# Patient Record
Sex: Female | Born: 1970 | Race: White | Hispanic: No | Marital: Married | Smoking: Current every day smoker
Health system: Southern US, Community
[De-identification: ages and names within clinical notes are randomized; demographics above are authoritative.]

## PROBLEM LIST (undated history)

## (undated) HISTORY — PX: TUBAL LIGATION: SHX77

---

## 2001-07-06 ENCOUNTER — Other Ambulatory Visit: Admission: RE | Admit: 2001-07-06 | Discharge: 2001-07-06 | Payer: Self-pay | Admitting: Obstetrics and Gynecology

## 2002-09-01 ENCOUNTER — Other Ambulatory Visit: Admission: RE | Admit: 2002-09-01 | Discharge: 2002-09-01 | Payer: Self-pay | Admitting: Obstetrics and Gynecology

## 2003-09-28 ENCOUNTER — Other Ambulatory Visit: Admission: RE | Admit: 2003-09-28 | Discharge: 2003-09-28 | Payer: Self-pay | Admitting: Obstetrics and Gynecology

## 2004-07-22 ENCOUNTER — Other Ambulatory Visit: Admission: RE | Admit: 2004-07-22 | Discharge: 2004-07-22 | Payer: Self-pay | Admitting: Obstetrics and Gynecology

## 2005-02-06 ENCOUNTER — Inpatient Hospital Stay (HOSPITAL_COMMUNITY): Admission: AD | Admit: 2005-02-06 | Discharge: 2005-02-06 | Payer: Self-pay | Admitting: Obstetrics and Gynecology

## 2005-02-11 ENCOUNTER — Encounter (INDEPENDENT_AMBULATORY_CARE_PROVIDER_SITE_OTHER): Payer: Self-pay | Admitting: Specialist

## 2005-02-11 ENCOUNTER — Inpatient Hospital Stay (HOSPITAL_COMMUNITY): Admission: AD | Admit: 2005-02-11 | Discharge: 2005-02-13 | Payer: Self-pay | Admitting: Obstetrics and Gynecology

## 2005-03-20 ENCOUNTER — Other Ambulatory Visit: Admission: RE | Admit: 2005-03-20 | Discharge: 2005-03-20 | Payer: Self-pay | Admitting: Obstetrics and Gynecology

## 2006-10-23 ENCOUNTER — Encounter: Admission: RE | Admit: 2006-10-23 | Discharge: 2006-10-23 | Payer: Self-pay | Admitting: Obstetrics and Gynecology

## 2006-11-17 IMAGING — US US OB COMP +14 WK
1 series · 13 of 28 positions shown · non-contrast
Comparison: none

CLINICAL DATA: 37 weeks estimated gestational age with contractions and pregnancy induced hypertension.

[Series 1: us ob comp +14 wk · 0.37mm/px · 13 of 32 slices shown]
[im 2/32]
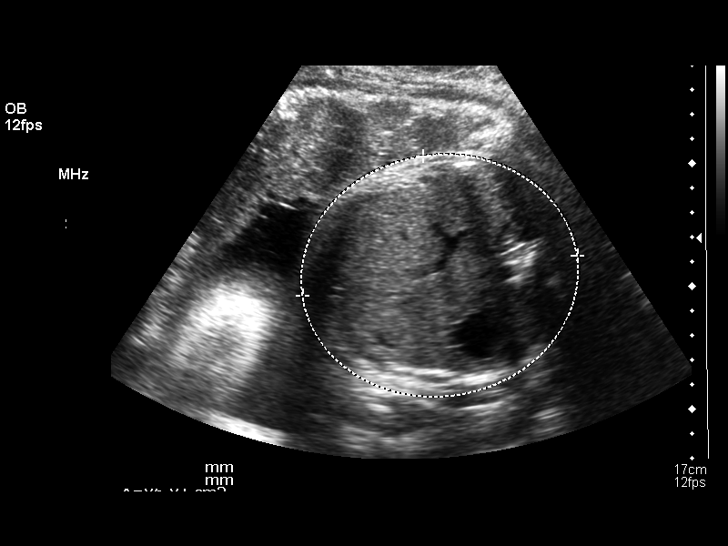
[im 4/32]
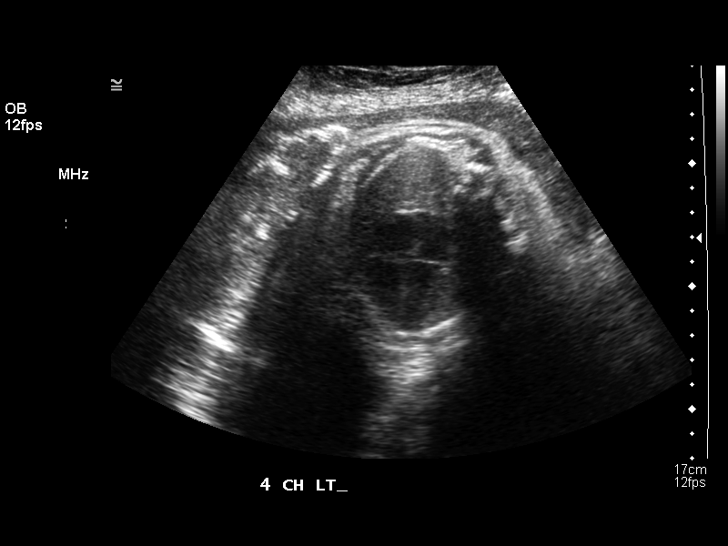
[im 6/32]
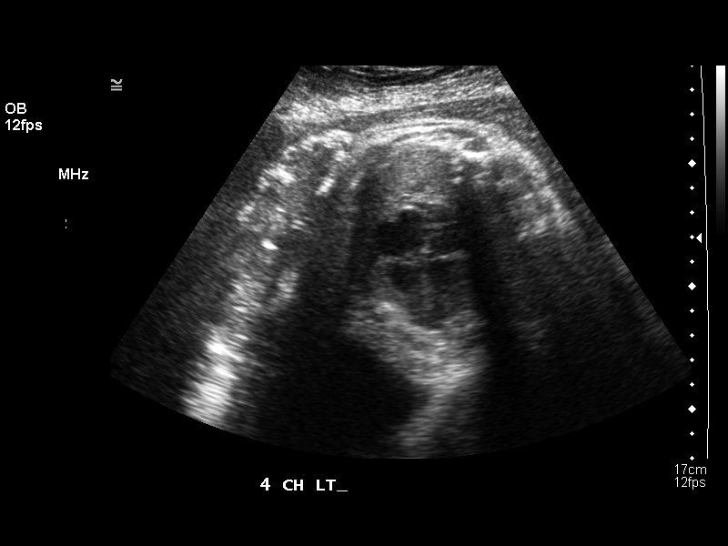
[im 9/32]
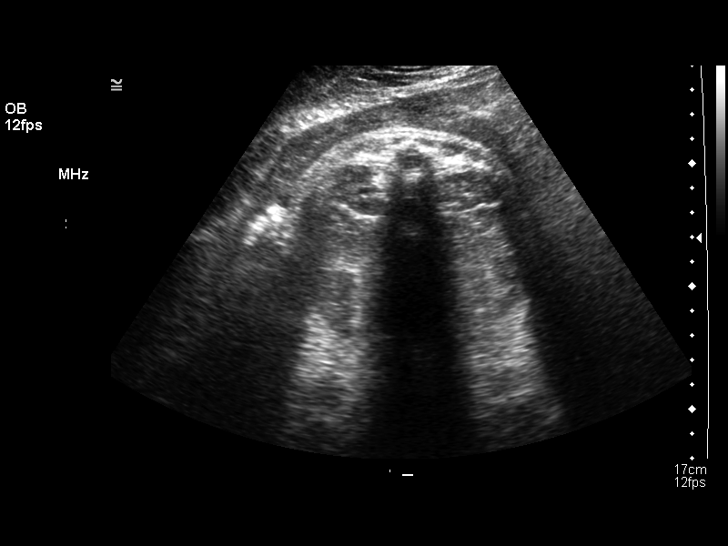
[im 11/32]
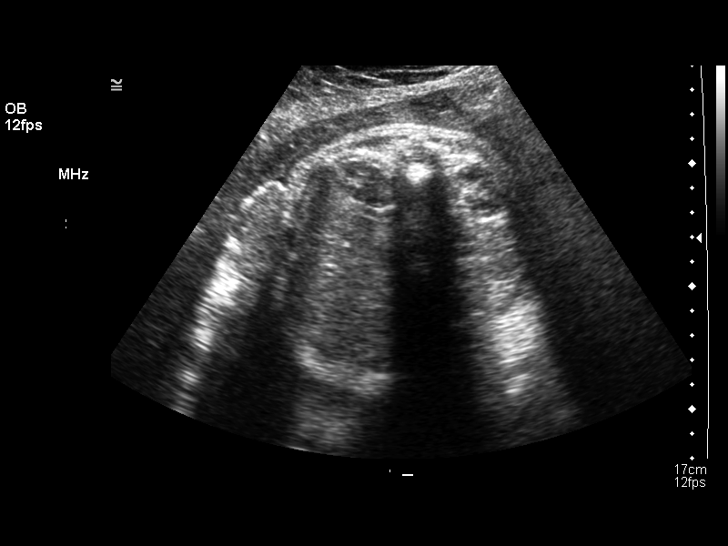
[im 13/32]
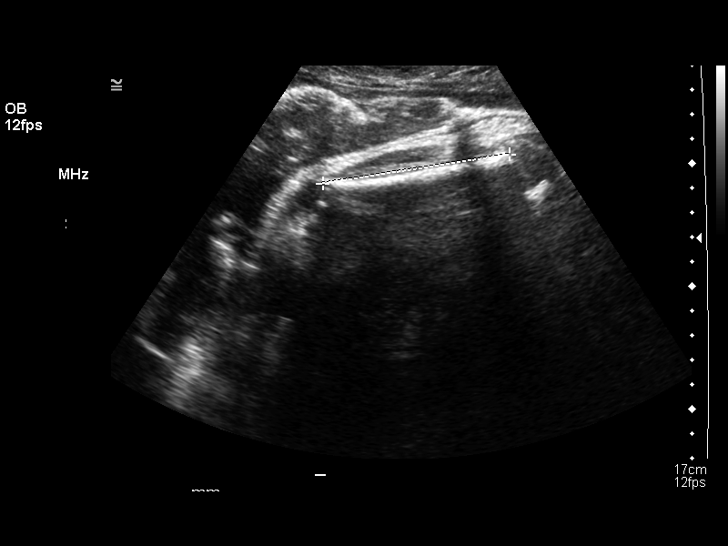
[im 17/32]
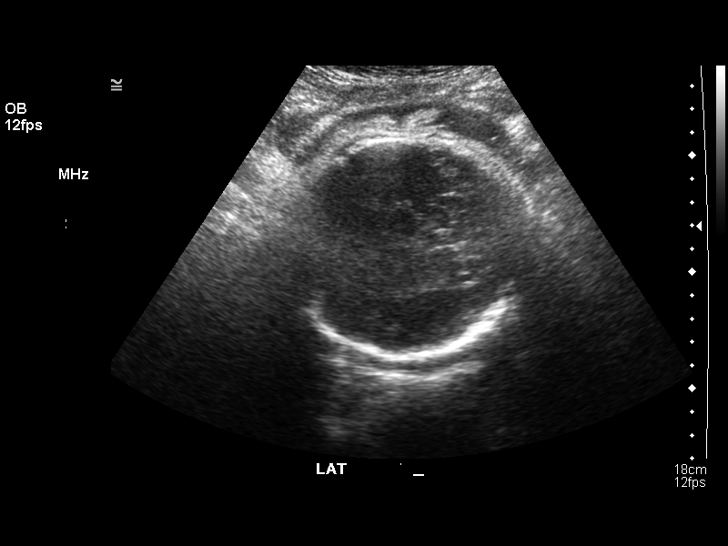
[im 19/32]
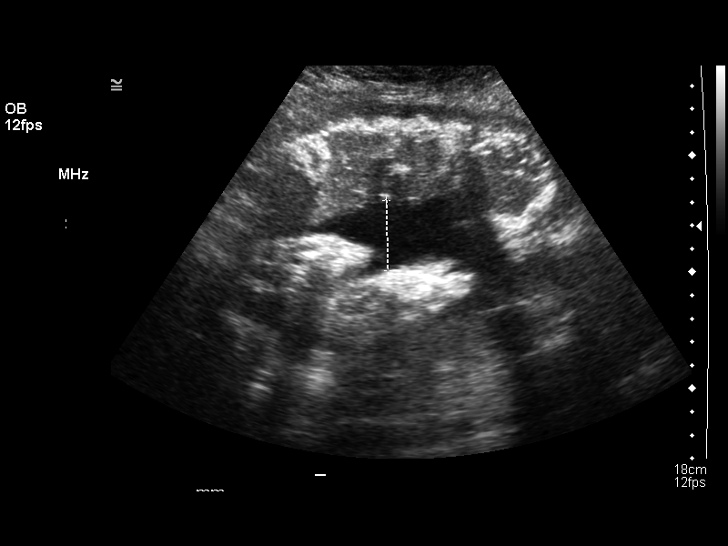
[im 21/32]
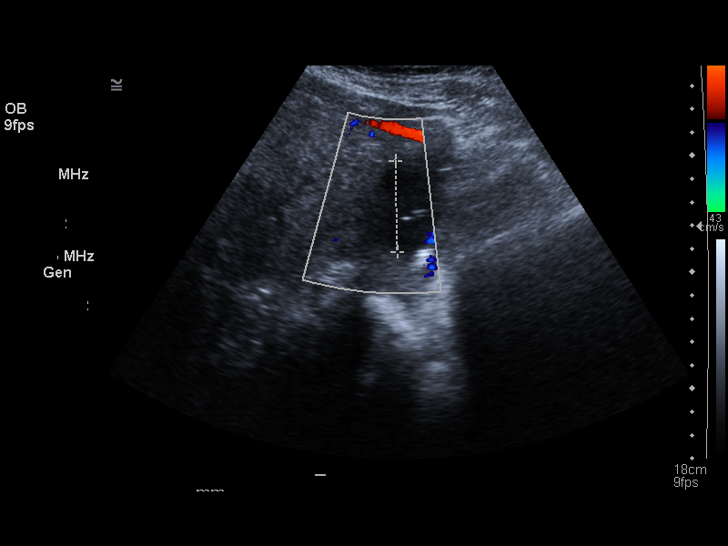
[im 23/32]
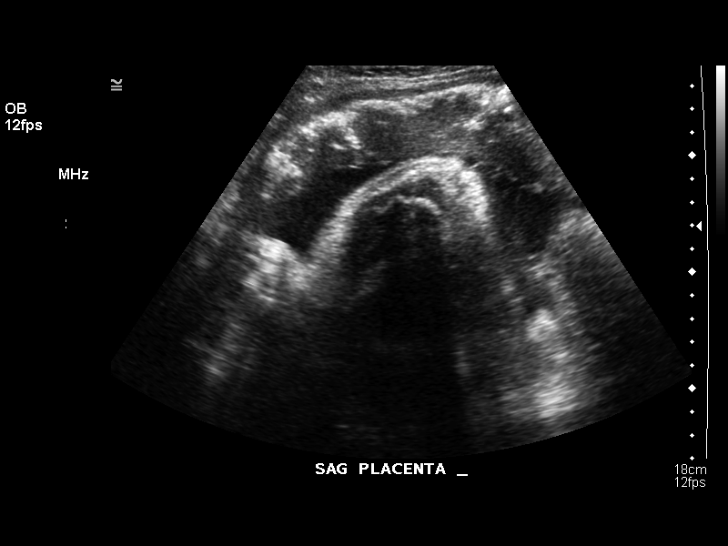
[im 26/32]
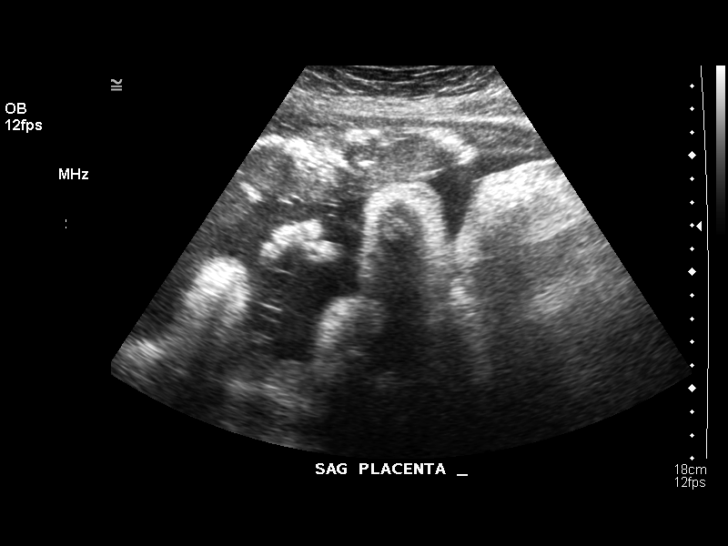
[im 28/32]
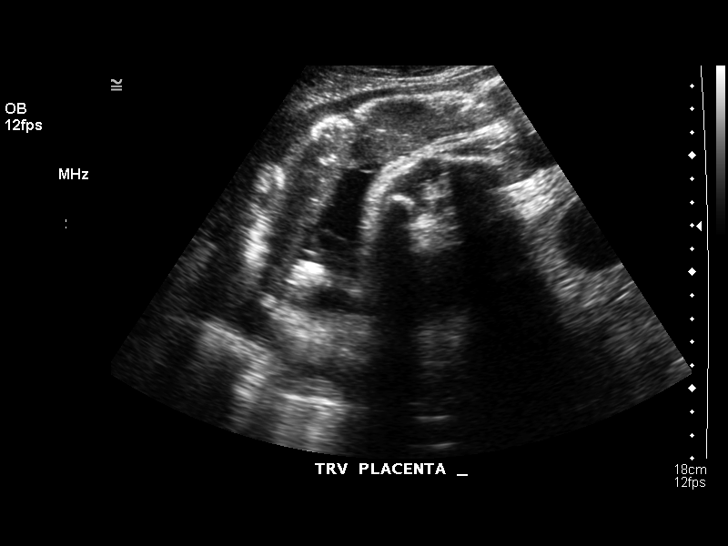
[im 30/32]
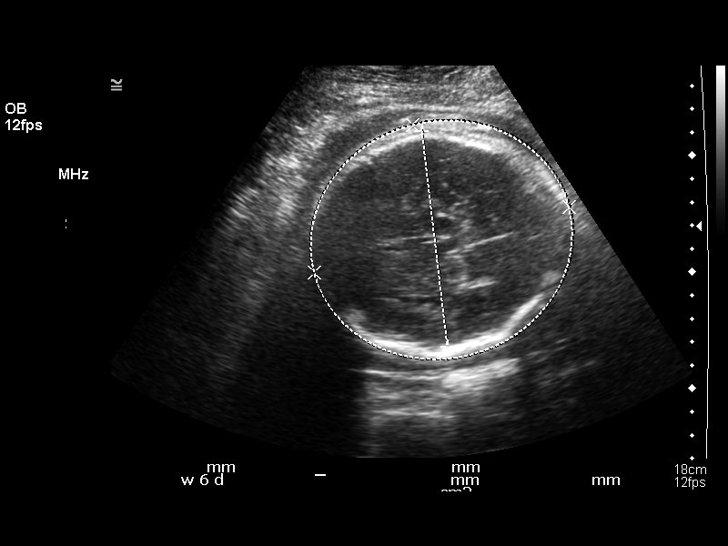

[13 of 28 positions shown; findings below may reference images not displayed]

OBSTETRICAL ULTRASOUND:
Number of Fetuses:  1
Heart Rate:  136
Movement:  Yes
Breathing:  Yes  
Presentation:  Cephalic
Placental Location:  Anterior
Grade:  III
Previa:  No
Amniotic Fluid (Subjective):  Normal
Amniotic Fluid (Objective):   9.4 cm AFI (5th -95th%ile = 7.3 ? 23.9 cm for 38 wks)

FETAL BIOMETRY
BPD:  9.4 cm   38 w 0 d
HC:  33.2 cm  37 w 6 d
AC:  33.9 cm   37 w 6 d
FL:  7.6 cm   39 w 0 d

MEAN GA:  38 w 1 d
Assigned GA:  37 w 6 d  Assigned EDC:  02/21/05
BPD/OFD: .89 (0.70 ? 0.86); FL/BPD:  0.82 (0.71 ? 0.87); FL/AC:  .23 (0.20 ? 0.24); HC/AC: .98 (0.92 ? 1.05)
EFW:  0069 g (H) 50th ? 75th%ile (3133 ? 4041 g) For 38 wks

FETAL ANATOMY
Lateral Ventricles:    Visualized 
Thalami/CSP:      Not visualized 
Posterior Fossa:  Not visualized 
Nuchal Region:      N/A
Spine:      Not visualized 
4 Chamber Heart on Left:      Visualized 
Stomach on Left:      Visualized 
3 Vessel Cord:    Visualized 
Cord Insertion site:    Not visualized 
Kidneys:  Visualized 
Bladder:  Visualized 
Extremities:      Not visualized 

MATERNAL UTERINE AND ADNEXAL FINDINGS
Cervix:   Not evaluated.
IMPRESSION: 1.  Single intrauterine pregnancy demonstrating an estimated gestational age by ultrasound of 38 weeks and 1 day.  Correlation with assigned gestational age of 37 weeks and 6 days suggests appropriate growth.  Currently the estimated fetal weight is between the 50th and 75th percentile for a 38 week gestation.  
2.  Subjectively and quantitatively normal amniotic fluid volume.
3.  No late developing fetal anatomic abnormalities are identified associated with the lateral ventricles, four chamber heart, stomach, kidneys or bladder.

## 2007-07-26 ENCOUNTER — Encounter: Admission: RE | Admit: 2007-07-26 | Discharge: 2007-07-26 | Payer: Self-pay | Admitting: Obstetrics and Gynecology

## 2008-02-08 ENCOUNTER — Ambulatory Visit: Payer: Self-pay | Admitting: Occupational Medicine

## 2009-05-05 IMAGING — MG MM DIAGNOSTIC UNILATERAL L
3 series · 3 of 3 positions shown · non-contrast
Comparison: Mammograms 10-19-06 and 10-23-06.

DG DIAGNOSTIC UNILATERAL L
CC and MLO view(s) were taken of the left breast.
Technologist: Jelks, Romie(BELTRAN RODRIGUEZ)

DIGITAL UNILATERAL LEFT DIAGNOSTIC MAMMOGRAM WITH CAD:
CLINICAL DATA: Six-month follow-up left breast.

[L CC]
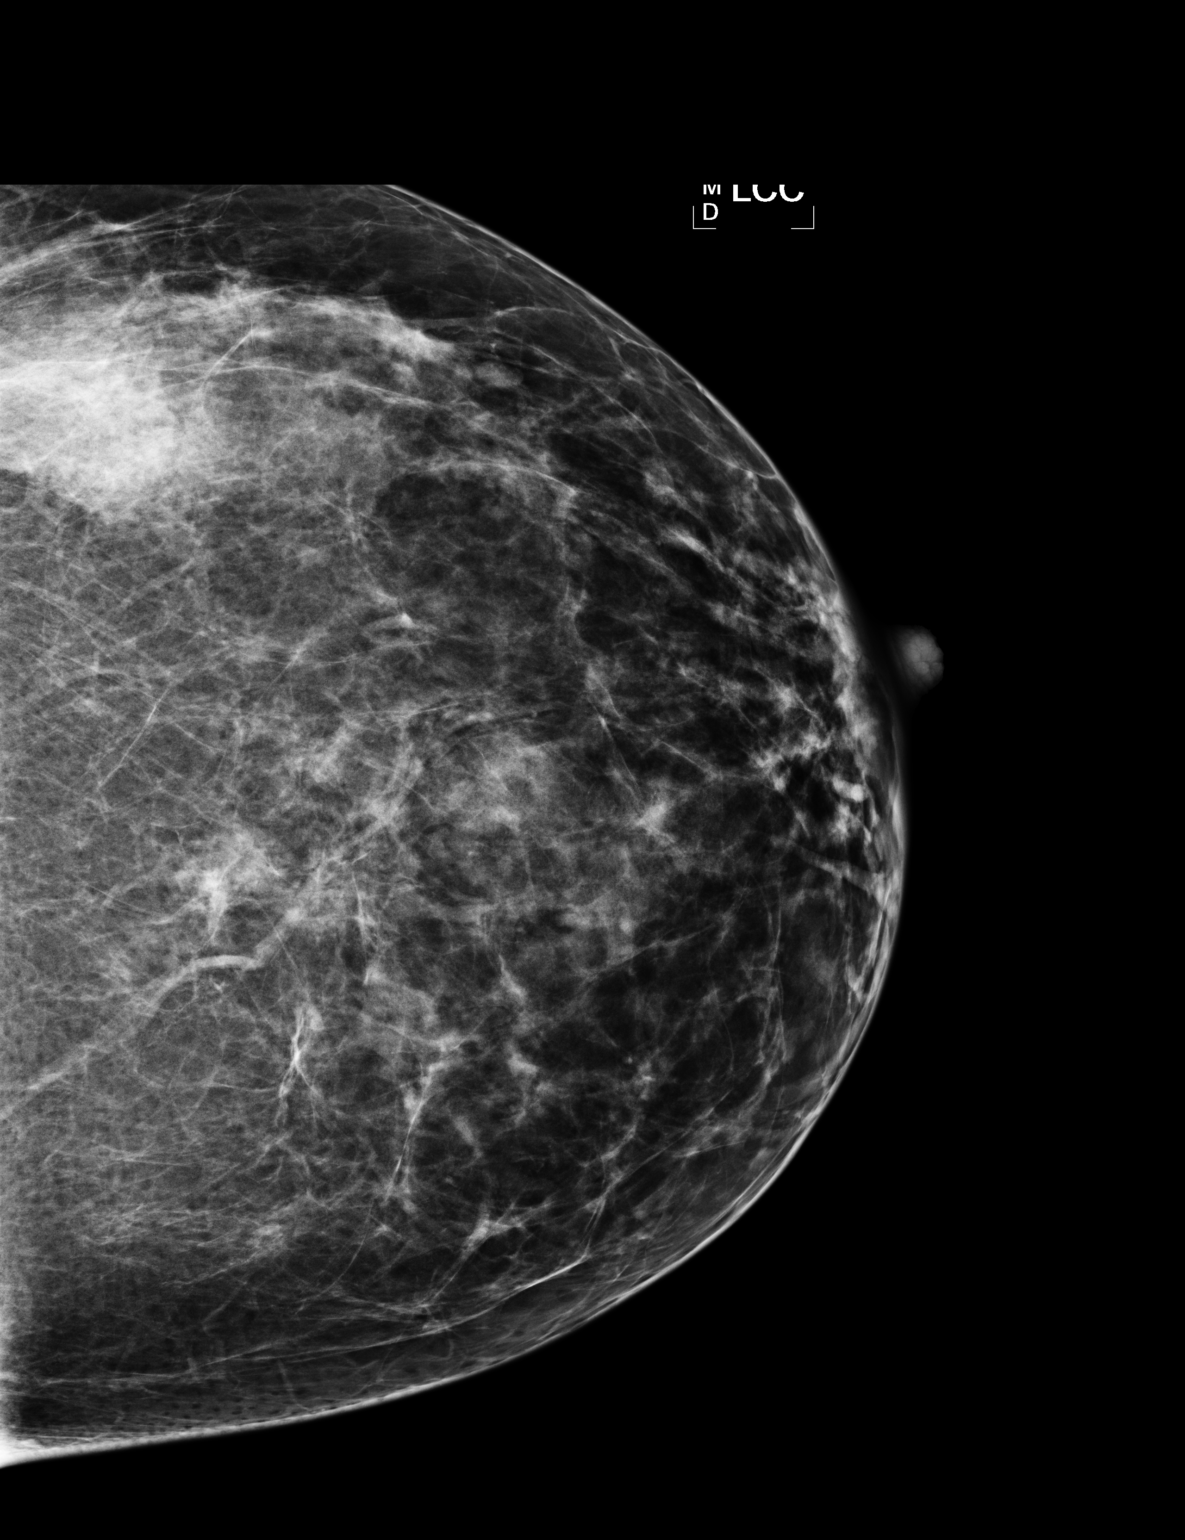

[L MLO (1 of 2)]
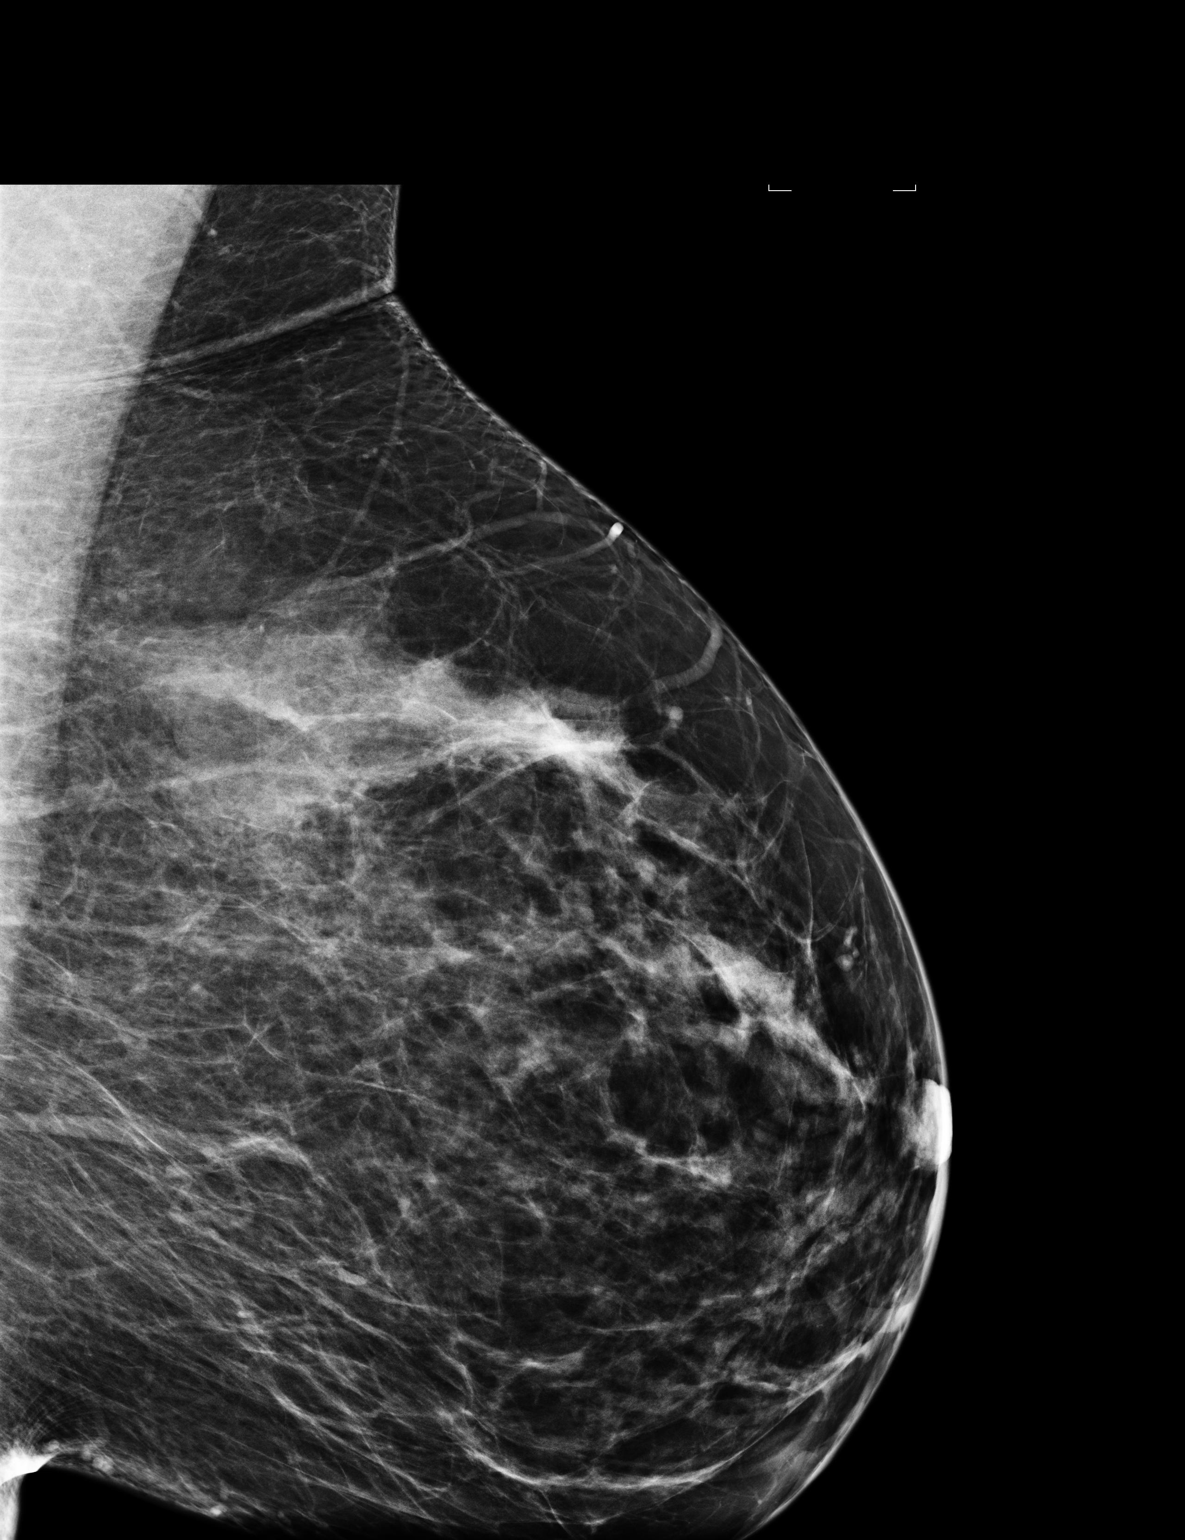

[L MLO (2 of 2)]
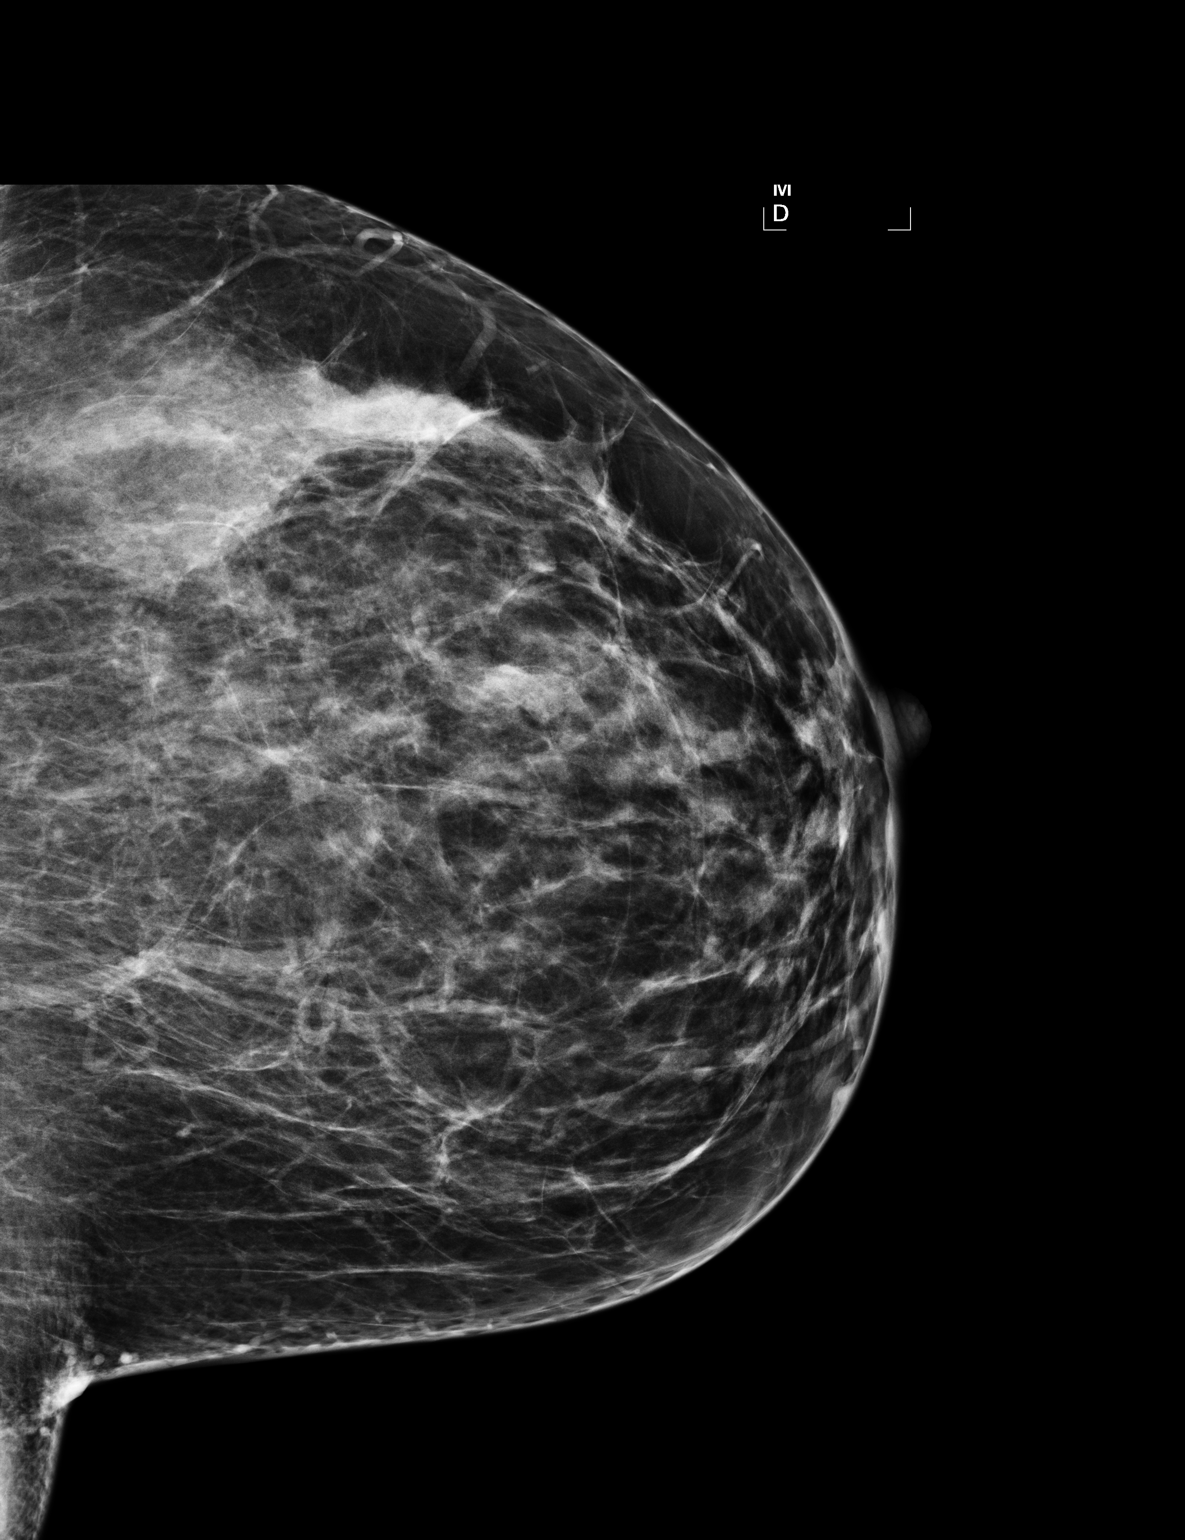

[3 of 3 positions shown; findings below may reference images not displayed]

CC and MLO views of the left breast 07-26-07 are submitted.  Asymmetric breast parenchyma is stable 
in the upper outer quadrant left breast unchanged compared to prior exam and no focal discrete mass
or suspicious microcalcifications are identified.
IMPRESSION: Benign findings.  Recommend routine screening mammogram back on schedule.

ASSESSMENT: Benign - BI-RADS 2

Screening mammogram of both breasts in 3 months.
ANALYZED BY COMPUTER AIDED DETECTION. ,

## 2010-08-16 NOTE — Op Note (Signed)
NAMECYNDIE, WOODBECK            ACCOUNT NO.:  [id number]   MEDICAL RECORD NO.:  [id number]          PATIENT TYPE:  INP   LOCATION:  9143                          FACILITY:  [facility]   PHYSICIAN:  Dineen Kid. Rana Snare, M.D.    DATE OF BIRTH:  06/18/1970   DATE OF PROCEDURE:  02/11/2005  DATE OF DISCHARGE:                                 OPERATIVE REPORT   PREOPERATIVE DIAGNOSIS:  Multiparity, desires sterility.   POSTOPERATIVE DIAGNOSIS:  Multiparity, desires sterility.   OPERATION/PROCEDURE:  Modified P[place]oy bilateral tubal ligation.   SURGEON:  Dineen Kid. Rana Snare, M.D.   ANESTHESIA:  Epidural.   INDICATIONS:  Mrs. Renshaw is a 40 year old, gravida 2, para 2, status post  spontaneous vaginal delivery who adamantly desires sterilization. The risks  and benefits of the procedure were discussed at length which include but not  limited to risk of infection, bleeding, damage to bowel and bladder, uterus,  tubes, ovaries, risk of tubal failure, __________ .  She gives informed  consent and wishes to proceed.   FINDINGS:  Normal fallopian tubes.   DESCRIPTION OF PROCEDURE:  After adequate analgesia, the patient was placed  in the supine position.  A 2 cm infraumbilical skin incision was made and  taken down sharply to the fascia.  The fascia was incised transversely.  Peritoneum was entered sharply  Army-Navy retractors were placed.  The left  fallopian tube was identified, grasped with Babcock clamp, confirmed by the  fimbriated end.  The mid portion was then grasped, totally ligated with 0  plain suture.  The proximal portion was ligated with a 0 silk. The central  portion was excised.  The tubal ostia was noted to be hemostatic and the  tube was then released in to the abdominal cavity.  The right fallopian tube  was identified, grasped with a Babcock clamp, confirmed by the fimbriated  end.  The mid portion of the tube was regrasped, doubly ligated with 0 plain  suture.  The proximal portion  was ligated with a 0 silk.  The mid portion  was then excised.  The tubal ostia was noted to be hemostatic. This released  back into the abdominal cavity.  The fascia was then closed with 0 Vicryl  figure-of-eight.  The skin was then closed in the subcuticular fashion with  3-0 Vicryl Rapide suture.  The incision was then injected with 0.25%  Marcaine, 10 mL total used.  The patient was stable on transferred to the  recovery room.  Inspection of the skin was normal x3.  Estimated blood loss  was less than 5 mL.      Dineen Kid Rana Snare, M.D.  Electronically Signed     DCL/MEDQ  D:  02/11/2005  T:  02/12/2005  Job:  [id number]

## 2013-08-14 ENCOUNTER — Emergency Department (HOSPITAL_COMMUNITY): Payer: BC Managed Care – PPO

## 2013-08-14 ENCOUNTER — Encounter (HOSPITAL_COMMUNITY): Payer: Self-pay | Admitting: Emergency Medicine

## 2013-08-14 ENCOUNTER — Emergency Department (HOSPITAL_COMMUNITY)
Admission: EM | Admit: 2013-08-14 | Discharge: 2013-08-14 | Disposition: A | Discharge status: Home / Self Care | Payer: BC Managed Care – PPO | Attending: Emergency Medicine | Admitting: Emergency Medicine

## 2013-08-14 DIAGNOSIS — Z3202 Encounter for pregnancy test, result negative: Secondary | ICD-10-CM | POA: Insufficient documentation

## 2013-08-14 DIAGNOSIS — R0789 Other chest pain: Secondary | ICD-10-CM

## 2013-08-14 DIAGNOSIS — R071 Chest pain on breathing: Secondary | ICD-10-CM | POA: Insufficient documentation

## 2013-08-14 DIAGNOSIS — F17200 Nicotine dependence, unspecified, uncomplicated: Secondary | ICD-10-CM | POA: Insufficient documentation

## 2013-08-14 LAB — CBC WITH DIFFERENTIAL/PLATELET
Basophils Absolute: 0 10*3/uL (ref 0.0–0.1)
Basophils Relative: 0 % (ref 0–1)
Eosinophils Absolute: 0.1 10*3/uL (ref 0.0–0.7)
Eosinophils Relative: 1 % (ref 0–5)
HCT: 38.9 % (ref 36.0–46.0)
Hemoglobin: 13.6 g/dL (ref 12.0–15.0)
LYMPHS ABS: 3.3 10*3/uL (ref 0.7–4.0)
Lymphocytes Relative: 39 % (ref 12–46)
MCH: 32.2 pg (ref 26.0–34.0)
MCHC: 35 g/dL (ref 30.0–36.0)
MCV: 92 fL (ref 78.0–100.0)
MONO ABS: 0.6 10*3/uL (ref 0.1–1.0)
Monocytes Relative: 7 % (ref 3–12)
Neutro Abs: 4.4 10*3/uL (ref 1.7–7.7)
Neutrophils Relative %: 53 % (ref 43–77)
Platelets: 214 10*3/uL (ref 150–400)
RBC: 4.23 MIL/uL (ref 3.87–5.11)
RDW: 12.2 % (ref 11.5–15.5)
WBC: 8.4 10*3/uL (ref 4.0–10.5)

## 2013-08-14 LAB — URINALYSIS, ROUTINE W REFLEX MICROSCOPIC
BILIRUBIN URINE: NEGATIVE
Glucose, UA: NEGATIVE mg/dL
HGB URINE DIPSTICK: NEGATIVE
Ketones, ur: NEGATIVE mg/dL
Leukocytes, UA: NEGATIVE
Nitrite: NEGATIVE
Protein, ur: NEGATIVE mg/dL
SPECIFIC GRAVITY, URINE: 1.025 (ref 1.005–1.030)
Urobilinogen, UA: 0.2 mg/dL (ref 0.0–1.0)
pH: 6 (ref 5.0–8.0)

## 2013-08-14 LAB — COMPREHENSIVE METABOLIC PANEL
ALT: 15 U/L (ref 0–35)
AST: 14 U/L (ref 0–37)
Albumin: 3.5 g/dL (ref 3.5–5.2)
Alkaline Phosphatase: 47 U/L (ref 39–117)
BUN: 12 mg/dL (ref 6–23)
CO2: 24 mEq/L (ref 19–32)
Calcium: 8.7 mg/dL (ref 8.4–10.5)
Chloride: 105 mEq/L (ref 96–112)
Creatinine, Ser: 0.82 mg/dL (ref 0.50–1.10)
GFR calc Af Amer: >90 mL/min (ref >90)
GFR calc non Af Amer: 86 mL/min — ABNORMAL LOW (ref >90)
Glucose, Bld: 89 mg/dL (ref 70–99)
Potassium: 4.3 mEq/L (ref 3.7–5.3)
Sodium: 139 mEq/L (ref 137–147)
Total Bilirubin: 0.3 mg/dL (ref 0.3–1.2)
Total Protein: 6.3 g/dL (ref 6.0–8.3)

## 2013-08-14 LAB — LIPASE, BLOOD: Lipase: 27 U/L (ref 11–59)

## 2013-08-14 LAB — PREGNANCY, URINE: Preg Test, Ur: NEGATIVE

## 2013-08-14 MED ORDER — TRAMADOL HCL 50 MG PO TABS
50.0000 mg | ORAL_TABLET | Freq: Once | ORAL | Status: AC
  Administered 2013-08-14: 50 mg via ORAL
  Filled 2013-08-14: qty 1

## 2013-08-14 MED ORDER — NAPROXEN 250 MG PO TABS: 250.0000 mg | ORAL_TABLET | Freq: Two times a day (BID) | ORAL | Status: AC

## 2013-08-14 MED ORDER — IBUPROFEN 400 MG PO TABS
400.0000 mg | ORAL_TABLET | Freq: Once | ORAL | Status: AC
  Administered 2013-08-14: 400 mg via ORAL
  Filled 2013-08-14: qty 1

## 2013-08-14 MED ORDER — METHOCARBAMOL 500 MG PO TABS: 1000.0000 mg | ORAL_TABLET | Freq: Four times a day (QID) | ORAL | Status: AC | PRN

## 2013-08-14 MED ORDER — TRAMADOL HCL 50 MG PO TABS: 50.0000 mg | ORAL_TABLET | Freq: Four times a day (QID) | ORAL | Status: AC | PRN

## 2013-08-14 NOTE — ED Notes (Signed)
Patient with no complaints at this time. Respirations even and unlabored. Skin warm/dry. Discharge instructions reviewed with patient at this time. Patient given opportunity to voice concerns/ask questions. Patient discharged at this time and left Emergency Department with steady gait.   

## 2013-08-14 NOTE — Discharge Instructions (Signed)
°Emergency Department Resource Guide °1) Find a Doctor and Pay Out of Pocket °Although you won't have to find out who is covered by your insurance plan, it is a good idea to ask around and get recommendations. You will then need to call the office and see if the doctor you have chosen will accept you as a new patient and what types of options they offer for patients who are self-pay. Some doctors offer discounts or will set up payment plans for their patients who do not have insurance, but you will need to ask so you aren't surprised when you get to your appointment. ° °2) Contact Your Local Health Department °Not all health departments have doctors that can see patients for sick visits, but many do, so it is worth a call to see if yours does. If you don't know where your local health department is, you can check in your phone book. The CDC also has a tool to help you locate your state's health department, and many state websites also have listings of all of their local health departments. ° °3) Find a Walk-in Clinic °If your illness is not likely to be very severe or complicated, you may want to try a walk in clinic. These are popping up all over the country in pharmacies, drugstores, and shopping centers. They're usually staffed by nurse practitioners or physician assistants that have been trained to treat common illnesses and complaints. They're usually fairly quick and inexpensive. However, if you have serious medical issues or chronic medical problems, these are probably not your best option. ° °No Primary Care Doctor: °- Call [facility] at  832-8000 - they can help you locate a primary care doctor that  accepts your insurance, provides certain services, etc. °- Physician Referral Service- 1-800-533-3463 ° °Chronic Pain Problems: °Organization         Address  Phone   Notes  °[facility]  (336) [phone] Patients need to be referred by their primary care doctor.  ° °Medication  Assistance: °Organization         Address  Phone   Notes  °[place] [facility] [address] °[place], [place] [address] (336) 641-8030 --Must be a resident of [place] °-- Must have NO insurance coverage whatsoever (no Medicaid/ Medicare, etc.) °-- The pt. MUST have a primary care doctor that directs their care regularly and follows them in the community °  °[facility]  (866) 331-1348   °[employer]  (888) 892-1162   ° °Agencies that provide inexpensive medical care: °Organization         Address  Phone   Notes  °[facility]  (336) 832-8035   °[facility]    (336) 832-7272   °[facility] [address] °[place], [place] [address] (336) 832-4777   °[facility] [address], °[place] (336) 271-4999   °[facility]    (336) 373-0678   °[facility]    (336) 272-1050   °[facility] ° [address], [place] Phone:  (336) 832-4444, Fax:  (336) 832-4440 Hours of Operation:  9 am - 6 pm, M-F.  Also accepts Medicaid/Medicare and self-pay.  °[facility] ° [address], [place] Phone: (336) [phone], Fax: (336) [phone]. Hours of Operation:  8:30 am - 5:30 pm, M-F.  Also accepts Medicaid and self-pay.  °[facility] [place] [address], [place] Phone: (336) 878-6027   °[facility] [address], [place], [place] (336)723-1848, Ext. 123 Mondays & Thursdays: 7-9 AM.  First 15 patients are seen on a first come, first serve basis. °  ° °Medicaid-accepting [place] Providers: ° °Organization         Address  Phone   Notes  °[facility] [address], [place] (336) 641-2100 Also accepts self-pay patients.  °[facility] [address], [place] ° (336) 856-9996   °[facility] [address], [place]  (336) 288-8857   °[facility] [address] [place] [address], [place] (336) 299-7000   °Veita Bland [address], [place]  ° (336) 373-1557 Only accepts [place] Access Medicaid patients after they have their name applied to their card.  ° °Self-Pay (no insurance) in [place]: ° °Organization         Address  Phone   Notes  °Sickle Cell Patients, [facility] [address], [place] (336) 832-1970   °[facility] [address], [place] (336) 832-4400   °[facility] ° [address], [place] (336) 992-4800   °[facility]/Dr. Osei-Bonsu ° [address] [place] [address], [place] or [address], [place] (336) [phone] Phone number for both [place] and [place] locations is the same.  °[facility] [address], [place] (336) 299-0000   °[facility] [address] [place] [address], [place] or [address] (336) 852-7530 °(336) 878-2260   °[facility] [address], [place] (336) 350-1642, phone; (336) 294-5005, fax Sees patients 1st and 3rd Saturday of every month.  Must not qualify for public or private insurance (i.e. Medicaid, Medicare, [place] Health Choice, Veterans' Benefits) • Household income should be no more than 200% of the poverty level •The clinic cannot treat you if you are pregnant or think you are pregnant • Sexually transmitted diseases are not treated at the clinic.  ° ° °Dental Care: °Organization         Address  Phone  Notes  °[place] [facility] [address], [place] (336) 641-6152 Accepts children up to age 21 who are enrolled in Medicaid or [place] Health Choice; pregnant women with a Medicaid card; and children who have applied for Medicaid or [place] Health Choice, but were declined, whose parents can pay a reduced fee at time of service.  °[place]  [facility] [place]  [address], [place] (336) 641-7733 Accepts children up to age 21 who are enrolled in Medicaid or [place] Health Choice; pregnant women with a Medicaid card; and children who have applied for Medicaid or [place] Health Choice, but were declined, whose parents can pay a reduced fee at time of service.  °[facility] ° [address], [place] (336) 641-4533 Patients are seen by appointment only. Walk-ins are not accepted. [facility] will see patients 18 years of age and older. °Monday - Tuesday (8am-5pm) °Most Wednesdays (8:30-5pm) °$30 per visit, cash only  °[facility] ° [address], [place] (336) 641-4533 Patients are seen by appointment only. Walk-ins are not accepted. [facility] will see patients 18 years of age and older. °One   Wednesday Evening (Monthly: Volunteer Based).  $30 per visit, cash only  °[facility]  (919) [phone] for adults; Children under age 4, call Graduate Pediatric Dentistry at ([phone]) [phone]. Children aged 4-14, please call ([phone]) [phone] to request a pediatric application. ° Dental services are provided in all areas of dental care including fillings, crowns and bridges, complete and partial dentures, implants, gum treatment, root canals, and extractions. Preventive care is also provided. Treatment is provided to both adults and children. °Patients are selected via a lottery and there is often a waiting list. °  °[facility] [address], °[place] ° (336) 763-8833 [email or web address] °  °[facility] [address], [place], [place] (336)723-1848, Ext. 123 Second and Fourth Thursday of each month, opens at 6:30 AM; Clinic ends at 9 AM.  Patients are seen on a first-come first-served basis, and a limited number are seen during each clinic.  ° °[facility] ° [address], [place], [place] (336) 723-7904    Eligibility Requirements °You must have lived in [place], [place], or [place] counties for at least the last three months. °  You cannot be eligible for state or federal sponsored healthcare insurance, including Veterans Administration, Medicaid, or Medicare. °  You generally cannot be eligible for healthcare insurance through your employer.  °  How to apply: °Eligibility screenings are held every Tuesday and Wednesday afternoon from 1:00 pm until 4:00 pm. You do not need an appointment for the interview!  °[facility] [address], [place], [place] 336-631-2330   °[facility]  336-342-8273   °[facility]  336-703-3100   °[facility]  336-570-6415   ° °Behavioral Health Resources in the Community: °Intensive Outpatient Programs °Organization         Address  Phone  Notes  °[facility] [address], [place], [place] 336-878-6098   °[facility] [address], [place], [place] 336-832-9800   °[facility] [address], [place], [place] ° 336-882-2125   °[facility] [address],  °[place], [place] 1-800-853-5163 or 336-641-4981   °Substance Abuse Resources °Organization         Address  Phone  Notes  °[facility]  336-882-2125   °[facility]  336-784-9470   °[facility]  336-285-9073   °[facility]  336-845-3988   °Residential & Outpatient Substance Abuse Program  1-800-659-3381   °Psychological Services °Organization         Address  Phone  Notes  °[facility]  336- 832-9600   °[facility]  336- 378-7881   °[facility] [address], [place] 1-800-853-5163 or 336-641-4981   ° °Mobile Crisis Teams °Organization         Address  Phone  Notes  °[facility], Mobile Crisis Care Unit  1-877-626-1772   °Assertive °[facility] ° [address]  [place], [place] 336-834-9664   °Sharon DeEsch [address] °[place] 336-554-5454   ° °Self-Help/Support Groups °Organization         Address  Phone             Notes  °[facility] of  - variety of support groups  336- 373-1402 Call for more information  °Narcotics Anonymous (NA), [facility] [address], °[place]  2 meetings at this location  ° °  Residential Treatment Programs Organization         Address  Phone  Notes  [facility] [address],    [place]  [phone]   [facility]  [address], [place] [phone], [place], [place] [phone]   [facility] [address], [place] [phone] Admissions: 8am-3pm M-F  [facility] [address]    [place], [place] [phone]   [facility] [address], [place], [place] [phone]   [facility] [address]  [place], [place] [phone]   [facility] Intensive Outpatient [address], [place], [place] [phone]   [facility] ([facility] [address]  [place], [place] [phone])[phone] or [phone]   [facility] ([facility]) [address] [place], [place] [phone] Accepts Medicaid  [facility] [address]  [place] [phone] Substance Abuse/Addiction Treatment   [facility] Organization         Address  Phone  Notes  [facility]  (949)528-4241(888) [phone]   Angie FavaJulie Brannon, PhD [address], [address] [place], [place]   ([phone](336) ([phone] or [phone](336) [phone]   [facility]   [address] [place], [place] [phone](336) [phone]   [facility] [address], [place], [place] [phone](336) [phone] Insurance/Medicaid/sponsorship through [facility] [address]                                    [place], [place] ([phone](336) [phone] Therapy/tele-psych/case    [facility] [address]   [place], [place] [phone](336) [phone]    Dr. Lolly MustacheArfeen  ([phone](336) [phone]   [facility] [place]  [employer] [facility] 1) [address], [place] 2) [address], [place] 3)  [address], [place] [phone](336) [phone](336) [phone]  [phone](336) [phone]   [facility] Child Abuse Hot[facility] (331)096-2633(336) 503-478-9536 or 445-198-9596(336) [phone] (After Hours)       Take the prescriptions as directed.  Apply moist heat or ice to the area(s) of discomfort, for 15 minutes at a time, several times per day for the next few days.  Do not fall asleep on a heating or ice pack.  Call your regular medical doctor tomorrow morning to schedule a follow up appointment within the next 2 days.  Return to the Emergency Department immediately if worsening.

## 2013-08-14 NOTE — ED Notes (Signed)
MD at bedside. 

## 2013-08-14 NOTE — ED Provider Notes (Signed)
CSN: [id number]     Arrival date & time 08/14/13  1158 History   First MD Initiated Contact with Patient 08/14/13 1315     Chief Complaint  Patient presents with  . Chest Pain      HPI Pt was seen at 1330. Per pt, c/o gradual onset and slow improvement in one episode of right sided lateral chest wall "pain" that began this morning. Pt states the is located in her right lateral chest wall, right side mid-back, and right neck areas. Describes the pain as "sharp." Pain worsens with coughing, palpation of the area and body position changes. Pt states she has been "working out in the yard" the past few days. Pt is right handed. Denies abd pain, no fevers, no N/V/D, no fevers, no palpitations, no SOB, no rash, no injury.      History reviewed. No pertinent past medical history.  Past Surgical History  Procedure Laterality Date  . Tubal ligation      History  Substance Use Topics  . Smoking status: Current Every Day Smoker  . Smokeless tobacco: Not on file  . Alcohol Use: No    Review of Systems ROS: Statement: All systems negative except as marked or noted in the HPI; Constitutional: Negative for fever and chills. ; ; Eyes: Negative for eye pain, redness and discharge. ; ; ENMT: Negative for ear pain, hoarseness, nasal congestion, sinus pressure and sore throat. ; ; Cardiovascular: Negative for palpitations, diaphoresis, dyspnea and peripheral edema. ; ; Respiratory: Negative for cough, wheezing and stridor. ; ; Gastrointestinal: Negative for nausea, vomiting, diarrhea, abdominal pain, blood in stool, hematemesis, jaundice and rectal bleeding. . ; ; Genitourinary: Negative for dysuria, flank pain and hematuria. ; ; Musculoskeletal: +right neck pain, right back pain, right lateral chest wall pain. Negative for swelling and trauma.; ; Skin: Negative for pruritus, rash, abrasions, blisters, bruising and skin lesion.; ; Neuro: Negative for headache, lightheadedness and neck stiffness. Negative for  weakness, altered level of consciousness , altered mental status, extremity weakness, paresthesias, involuntary movement, seizure and syncope.      Allergies  Hydrocodone-acetaminophen  Home Medications   Prior to Admission medications   Not on File   BP 147/89  Pulse 84  Temp(Src) 97.9 F (36.6 C) (Oral)  Resp 16  Ht 5\' 2"  (1.575 m)  Wt 180 lb (81.647 kg)  BMI 32.91 kg/m2  SpO2 99% Physical Exam 1335: Physical examination:  Nursing notes reviewed; Vital signs and O2 SAT reviewed;  Constitutional: Well developed, Well nourished, Well hydrated, In no acute distress; Head:  Normocephalic, atraumatic; Eyes: EOMI, PERRL, No scleral icterus; ENMT: TM's clear bilat. Mouth and pharynx normal, Mucous membranes moist; Neck: Supple, Full range of motion, No lymphadenopathy; Cardiovascular: Regular rate and rhythm, No murmur, rub, or gallop; Respiratory: Breath sounds clear & equal bilaterally, No rales, rhonchi, wheezes.  Speaking full sentences with ease, Normal respiratory effort/excursion; Chest: +right lower posterior-lateral chest wall tender to palp. No deformity, no soft tissue crepitus, no rash. Movement normal; Abdomen: Soft, Nontender, Nondistended, Normal bowel sounds; Genitourinary: No CVA tenderness; Spine:  No midline CS, TS, LS tenderness. +TTP right hypertonic trapezius and thoracic paraspinal muscles.;; Extremities: Pulses normal, No tenderness, No edema, No calf edema or asymmetry.; Neuro: AA&Ox3, Major CN grossly intact.  Speech clear. No gross focal motor or sensory deficits in extremities. Climbs on and off stretcher easily by herself. Gait steady.; Skin: Color normal, Warm, Dry.   ED Course  Procedures     EKG  Interpretation   Date/Time:  Sunday Aug 14 2013 12:19:12 EDT Ventricular Rate:  76 PR Interval:  108 QRS Duration: 82 QT Interval:  382 QTC Calculation: 429 R Axis:   69 Text Interpretation:  Sinus rhythm with short PR Otherwise normal ECG  Confirmed by Adriana SimasOOK   MD, BRIAN ([id number]) on 08/14/2013 12:45:03 PM      MDM  MDM Reviewed: previous chart, nursing note and vitals Reviewed previous: labs Interpretation: labs, ECG and x-ray    Results for orders placed during the hospital encounter of 08/14/13  URINALYSIS, ROUTINE W REFLEX MICROSCOPIC      Result Value Ref Range   Color, Urine YELLOW  YELLOW   APPearance CLEAR  CLEAR   Specific Gravity, Urine 1.025  1.005 - 1.030   pH 6.0  5.0 - 8.0   Glucose, UA NEGATIVE  NEGATIVE mg/dL   Hgb urine dipstick NEGATIVE  NEGATIVE   Bilirubin Urine NEGATIVE  NEGATIVE   Ketones, ur NEGATIVE  NEGATIVE mg/dL   Protein, ur NEGATIVE  NEGATIVE mg/dL   Urobilinogen, UA 0.2  0.0 - 1.0 mg/dL   Nitrite NEGATIVE  NEGATIVE   Leukocytes, UA NEGATIVE  NEGATIVE  PREGNANCY, URINE      Result Value Ref Range   Preg Test, Ur NEGATIVE  NEGATIVE  CBC WITH DIFFERENTIAL      Result Value Ref Range   WBC 8.4  4.0 - 10.5 K/uL   RBC 4.23  3.87 - 5.11 MIL/uL   Hemoglobin 13.6  12.0 - 15.0 g/dL   HCT 40.138.9  02.736.0 - 25.346.0 %   MCV 92.0  78.0 - 100.0 fL   MCH 32.2  26.0 - 34.0 pg   MCHC 35.0  30.0 - 36.0 g/dL   RDW 66.412.2  40.311.5 - 47.415.5 %   Platelets 214  150 - 400 K/uL   Neutrophils Relative % 53  43 - 77 %   Neutro Abs 4.4  1.7 - 7.7 K/uL   Lymphocytes Relative 39  12 - 46 %   Lymphs Abs 3.3  0.7 - 4.0 K/uL   Monocytes Relative 7  3 - 12 %   Monocytes Absolute 0.6  0.1 - 1.0 K/uL   Eosinophils Relative 1  0 - 5 %   Eosinophils Absolute 0.1  0.0 - 0.7 K/uL   Basophils Relative 0  0 - 1 %   Basophils Absolute 0.0  0.0 - 0.1 K/uL  COMPREHENSIVE METABOLIC PANEL      Result Value Ref Range   Sodium 139  137 - 147 mEq/L   Potassium 4.3  3.7 - 5.3 mEq/L   Chloride 105  96 - 112 mEq/L   CO2 24  19 - 32 mEq/L   Glucose, Bld 89  70 - 99 mg/dL   BUN 12  6 - 23 mg/dL   Creatinine, Ser 2.590.82  0.50 - 1.10 mg/dL   Calcium 8.7  8.4 - 56.310.5 mg/dL   Total Protein 6.3  6.0 - 8.3 g/dL   Albumin 3.5  3.5 - 5.2 g/dL   AST 14  0 - 37 U/L    ALT 15  0 - 35 U/L   Alkaline Phosphatase 47  39 - 117 U/L   Total Bilirubin 0.3  0.3 - 1.2 mg/dL   GFR calc non Af Amer 86 (*) >90 mL/min   GFR calc Af Amer >90  >90 mL/min  LIPASE, BLOOD      Result Value Ref Range   Lipase 27  11 - 59 U/L   Dg Chest 2 View 08/14/2013   CLINICAL DATA:  Right-sided chest pain  EXAM: CHEST  2 VIEW  COMPARISON:  No comparisons  FINDINGS: Normal cardiac and mediastinal contours. No consolidative pulmonary opacities. No pleural effusion or pneumothorax. Regional skeleton is unremarkable.  IMPRESSION: No acute cardiopulmonary process.   Electronically Signed   By: Annia Beltrew  Davis M.D.   On: 08/14/2013 12:35    1515:  Doubt PE as cause for symptoms with low risk Wells. Workup reassuring. Will tx symptomatically at this time. Feels better after meds and wants to go home now. Dx and testing d/w pt and family.  Questions answered.  Verb understanding, agreeable to d/c home with outpt f/u.    Laray AngerKathleen M Kaliope Quinonez, DO 08/17/13 Izell [id number]

## 2013-08-14 NOTE — ED Notes (Signed)
Pt c/o right side posterior shoulder pain that radiates down right chest area and ends at right abd area, pain is worse with movement and certain positions, feels a tightness when she coughs, admits to nausea, states that the pain and cough started this am, denies any injury, cough is non productive,

## 2018-08-16 ENCOUNTER — Other Ambulatory Visit: Payer: Self-pay | Admitting: Obstetrics and Gynecology

## 2018-08-16 DIAGNOSIS — R921 Mammographic calcification found on diagnostic imaging of breast: Secondary | ICD-10-CM

## 2018-08-20 ENCOUNTER — Other Ambulatory Visit: Payer: Self-pay

## 2018-08-20 ENCOUNTER — Ambulatory Visit
Admission: RE | Admit: 2018-08-20 | Discharge: 2018-08-20 | Disposition: A | Discharge status: Home / Self Care | Payer: Managed Care, Other (non HMO) | Source: Ambulatory Visit | Attending: Obstetrics and Gynecology | Admitting: Obstetrics and Gynecology

## 2018-08-20 ENCOUNTER — Encounter (INDEPENDENT_AMBULATORY_CARE_PROVIDER_SITE_OTHER): Payer: Self-pay

## 2018-08-20 DIAGNOSIS — R921 Mammographic calcification found on diagnostic imaging of breast: Secondary | ICD-10-CM
